# Patient Record
Sex: Male | Born: 1960 | Race: Black or African American | Hispanic: No | Marital: Married | Smoking: Current every day smoker
Health system: Southern US, Community
[De-identification: ages and names within clinical notes are randomized; demographics above are authoritative.]

## PROBLEM LIST (undated history)

## (undated) HISTORY — PX: SHOULDER SURGERY: SHX246

---

## 2010-07-08 ENCOUNTER — Emergency Department (HOSPITAL_COMMUNITY): Admission: EM | Admit: 2010-07-08 | Discharge: 2010-07-08 | Payer: Self-pay | Admitting: Emergency Medicine

## 2010-12-19 LAB — DIFFERENTIAL
Basophils Absolute: 0 10*3/uL (ref 0.0–0.1)
Eosinophils Relative: 1 % (ref 0–5)
Lymphocytes Relative: 29 % (ref 12–46)
Lymphs Abs: 2.1 10*3/uL (ref 0.7–4.0)
Monocytes Absolute: 0.5 10*3/uL (ref 0.1–1.0)
Neutro Abs: 4.7 10*3/uL (ref 1.7–7.7)

## 2010-12-19 LAB — BASIC METABOLIC PANEL
BUN: 19 mg/dL (ref 6–23)
Chloride: 108 mEq/L (ref 96–112)
GFR calc Af Amer: >60 mL/min (ref >60)
GFR calc non Af Amer: 53 mL/min — ABNORMAL LOW (ref >60)
Potassium: 4 mEq/L (ref 3.5–5.1)
Sodium: 140 mEq/L (ref 135–145)

## 2010-12-19 LAB — CBC
HCT: 43 % (ref 39.0–52.0)
Hemoglobin: 14.3 g/dL (ref 13.0–17.0)
MCV: 87.9 fL (ref 78.0–100.0)
RBC: 4.89 MIL/uL (ref 4.22–5.81)
RDW: 13.9 % (ref 11.5–15.5)
WBC: 7.3 10*3/uL (ref 4.0–10.5)

## 2010-12-19 LAB — RAPID URINE DRUG SCREEN, HOSP PERFORMED
Amphetamines: NOT DETECTED
Benzodiazepines: NOT DETECTED
Cocaine: POSITIVE — AB

## 2010-12-19 LAB — ETHANOL: Alcohol, Ethyl (B): <5 mg/dL (ref 0–10)

## 2012-08-24 ENCOUNTER — Emergency Department (HOSPITAL_COMMUNITY): Payer: Self-pay

## 2012-08-24 ENCOUNTER — Emergency Department (HOSPITAL_COMMUNITY)
Admission: EM | Admit: 2012-08-24 | Discharge: 2012-08-24 | Disposition: A | Discharge status: Home / Self Care | Payer: Self-pay | Attending: Emergency Medicine | Admitting: Emergency Medicine

## 2012-08-24 ENCOUNTER — Encounter (HOSPITAL_COMMUNITY): Payer: Self-pay | Admitting: *Deleted

## 2012-08-24 DIAGNOSIS — F17200 Nicotine dependence, unspecified, uncomplicated: Secondary | ICD-10-CM | POA: Insufficient documentation

## 2012-08-24 DIAGNOSIS — M25519 Pain in unspecified shoulder: Secondary | ICD-10-CM | POA: Insufficient documentation

## 2012-08-24 DIAGNOSIS — R9389 Abnormal findings on diagnostic imaging of other specified body structures: Secondary | ICD-10-CM | POA: Insufficient documentation

## 2012-08-24 DIAGNOSIS — M25512 Pain in left shoulder: Secondary | ICD-10-CM

## 2012-08-24 MED ORDER — CYCLOBENZAPRINE HCL 10 MG PO TABS: 10.0000 mg | ORAL_TABLET | Freq: Two times a day (BID) | ORAL | Status: DC | PRN

## 2012-08-24 MED ORDER — IBUPROFEN 600 MG PO TABS: 600.0000 mg | ORAL_TABLET | Freq: Four times a day (QID) | ORAL | Status: DC | PRN

## 2012-08-24 NOTE — ED Notes (Signed)
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 Pt states " I figured this was the job finally catching up to me or old age." "I'm just trying to be proactive, that's why I came to get checked out".

## 2012-08-24 NOTE — ED Notes (Signed)
Pt is here with left mid upper back pain that wraps around his left shoulder and has pain with movement.  No chest pain

## 2012-08-24 NOTE — ED Provider Notes (Signed)
History     CSN: [id number]  Arrival date & time 08/24/12  1024   First MD Initiated Contact with Patient 08/24/12 1111      Chief Complaint  Patient presents with  . Back Pain    (Consider location/radiation/quality/duration/timing/severity/associated sxs/prior treatment) HPI  51 year old male presents complaining of back pain.  Patient reports for the past 2 weeks he has had intermittent pain to his left shoulder most specifically to his shoulder blade. Onset was gradual, intermittent, mild in severity, Pain is a sharp sensation worsening with shoulder movement and improves with rest. Pain pain medication be felt to the left side of neck when he turned his neck to the right side.  He denies fever, chills, chest pain, shortness of breath, abdominal pain, dysuria, hematuria. He denies lightheadedness, or dizziness. No numbness or tingling sensation. He has not noticed any rash. No treatment tried. Patient is right-handed however he has a right shoulder surgery as a kid and has to use his left arm for any heavy movement. He did recall having to work the job several weeks ago when he has to do repetitive motion with his left arm. He has no significant vascular disease, and no history of kidney stone.    Pt is a smoker.  No family hx of cancer.  No fever, weight changes, nights sweats or myalgias.    History reviewed. No pertinent past medical history.  Past Surgical History  Procedure Date  . Shoulder surgery     right    No family history on file.  History  Substance Use Topics  . Smoking status: Current Every Day Smoker  . Smokeless tobacco: Not on file  . Alcohol Use: Yes     Comment: occ      Review of Systems  Constitutional: Negative for fever.  HENT: Negative for neck stiffness.   Respiratory: Negative for chest tightness and shortness of breath.   Cardiovascular: Negative for chest pain.  Musculoskeletal: Negative for back pain.  Skin: Negative for rash.    Neurological: Negative for numbness.    Allergies  Review of patient's allergies indicates no known allergies.  Home Medications  No current outpatient prescriptions on file.  BP 127/97  Pulse 87  Temp 97.8 F (36.6 C) (Oral)  Resp 18  SpO2 94%  Physical Exam  Nursing note and vitals reviewed. Constitutional: He is oriented to person, place, and time. He appears well-developed and well-nourished. No distress.  HENT:  Head: Atraumatic.  Eyes: Conjunctivae normal are normal.  Neck: Normal range of motion. Neck supple. No JVD present.  Cardiovascular: Normal rate and regular rhythm.   Pulmonary/Chest: Effort normal and breath sounds normal. No stridor. No respiratory distress. He exhibits no tenderness.  Abdominal: Soft. There is no tenderness.       No CVA tenderness  Musculoskeletal: He exhibits tenderness (mild tenderness to inferior aspect of L scapula on palpation.  No overlying skin changes.  L shoulder with FROM, radial pulses 2+, normal sensation to light touch through major nerve distribution.  ). He exhibits no edema.  Lymphadenopathy:    He has no cervical adenopathy.  Neurological: He is alert and oriented to person, place, and time.  Skin: Skin is warm. No rash noted.  Psychiatric: He has a normal mood and affect.    ED Course  Procedures (including critical care time)  Labs Reviewed - No data to display No results found.   No diagnosis found.   Date: 08/24/2012  Rate: 75  Rhythm: normal sinus rhythm  QRS Axis: normal  Intervals: normal  ST/T Wave abnormalities: nonspecific ST/T changes  Conduction Disutrbances:none  Narrative Interpretation: ST elevation suggestive of early repol  Old EKG Reviewed: none available  Results for orders placed during the hospital encounter of 07/08/10  ETHANOL      Component Value Range   Alcohol, Ethyl (B)    0 - 10 mg/dL   Value: <5            LOWEST DETECTABLE LIMIT FOR     SERUM ALCOHOL IS 5 mg/dL     FOR  MEDICAL PURPOSES ONLY  BASIC METABOLIC PANEL      Component Value Range   Sodium 140  135 - 145 mEq/L   Potassium 4.0  3.5 - 5.1 mEq/L   Chloride 108  96 - 112 mEq/L   CO2 25  19 - 32 mEq/L   Glucose, Bld 105 (*) 70 - 99 mg/dL   BUN 19  6 - 23 mg/dL   Creatinine, Ser 4.78  0.4 - 1.5 mg/dL   Calcium 9.3  8.4 - 29.5 mg/dL   GFR calc non Af Amer 53 (*) >60 mL/min   GFR calc Af Amer    >60 mL/min   Value: >60            The eGFR has been calculated     using the MDRD equation.     This calculation has not been     validated in all clinical     situations.     eGFR's persistently     <60 mL/min signify     possible Chronic Kidney Disease.  CBC      Component Value Range   WBC 7.3  4.0 - 10.5 K/uL   RBC 4.89  4.22 - 5.81 MIL/uL   Hemoglobin 14.3  13.0 - 17.0 g/dL   HCT 62.1  30.8 - 65.7 %   MCV 87.9  78.0 - 100.0 fL   MCH 29.3  26.0 - 34.0 pg   MCHC 33.3  30.0 - 36.0 g/dL   RDW 84.6  96.2 - 95.2 %   Platelets 304  150 - 400 K/uL  DIFFERENTIAL      Component Value Range   Neutrophils Relative 64  43 - 77 %   Neutro Abs 4.7  1.7 - 7.7 K/uL   Lymphocytes Relative 29  12 - 46 %   Lymphs Abs 2.1  0.7 - 4.0 K/uL   Monocytes Relative 6  3 - 12 %   Monocytes Absolute 0.5  0.1 - 1.0 K/uL   Eosinophils Relative 1  0 - 5 %   Eosinophils Absolute 0.1  0.0 - 0.7 K/uL   Basophils Relative 0  0 - 1 %   Basophils Absolute 0.0  0.0 - 0.1 K/uL  DRUG SCREEN PANEL, EMERGENCY      Component Value Range   Opiates NONE DETECTED  NONE DETECTED   Cocaine POSITIVE (*) NONE DETECTED   Benzodiazepines NONE DETECTED  NONE DETECTED   Amphetamines NONE DETECTED  NONE DETECTED   Tetrahydrocannabinol POSITIVE (*) NONE DETECTED   Barbiturates    NONE DETECTED   Value: NONE DETECTED            DRUG SCREEN FOR MEDICAL PURPOSES     ONLY.  IF CONFIRMATION IS NEEDED     FOR ANY PURPOSE, NOTIFY LAB     WITHIN 5 DAYS.  LOWEST DETECTABLE LIMITS     FOR URINE DRUG SCREEN     Drug Class        Cutoff (ng/mL)     Amphetamine      1000     Barbiturate      200     Benzodiazepine   200     Tricyclics       300     Opiates          300     Cocaine          300     THC              50   Dg Chest 2 View  08/24/2012  *RADIOLOGY REPORT*  Clinical Data: Left chest and shoulder pain.  Smoker.  CHEST - 2 VIEW  Comparison:  None.  Findings:  A ill-defined nodular opacity is seen in the left upper lung field on the frontal projection, which could represent a pulmonary nodule or focus of inflammatory or infectious infiltrate.  The lung fields otherwise clear.  No evidence of pleural effusion. Heart size is normal.  No hilar or mediastinal mass identified.  IMPRESSION: Subtle ill-defined asymmetric nodular opacity in the left upper lung field.  Neoplasm or infiltrate cannot be excluded.  Consider chest CT without contrast for further evaluation.   Original Report Authenticated By: Myles Rosenthal, M.D.    Ct Chest Wo Contrast  08/24/2012  *RADIOLOGY REPORT*  Clinical Data: Back pain.  Possible lung lesion.  CT CHEST WITHOUT CONTRAST  Technique:  Multidetector CT imaging of the chest was performed following the standard protocol without IV contrast.  Comparison: Chest x-ray 08/24/2012.  Findings:  Mediastinum: Heart size is normal. There is no significant pericardial fluid, thickening or pericardial calcification.  There do appear to be some small extensions of fluid from the superior pericardial recess posterior to the ascending thoracic aorta (a benign variant). No pathologically enlarged mediastinal or hilar lymph nodes. Please note that accurate exclusion of hilar adenopathy is limited on noncontrast CT scans.  Esophagus is unremarkable in appearance.  There is atherosclerosis of the thoracic aorta, the great vessels of the mediastinum and the coronary arteries, including calcified atherosclerotic plaque in the left anterior descending coronary arteries.  Lungs/Pleura: 3 mm subpleural nodule in the  anterior aspect of the right lower lobe (image 40 of series 3).  4 mm subpleural nodule in the apex of the right upper lobe (image 13 of series 3).  4 mm subpleural nodule in the periphery of the left lower lobe (image 40 of series 3).  2 mm subpleural nodule in the periphery of the lingula (image 27 of series 3).  No other larger more suspicious appearing pulmonary nodules or masses are otherwise identified. Specifically, in the left upper lobe there is no suspicious abnormality to account for the perceived finding on the recent chest radiograph.  Mild diffuse bronchial wall thickening with mild paraseptal emphysema.  Upper Abdomen: Unremarkable.  Musculoskeletal: Orthopedic fixation in the coracoid process of the right scapula. There are no aggressive appearing lytic or blastic lesions noted in the visualized portions of the skeleton.  IMPRESSION: 1.  No suspicious appearing pulmonary nodules or masses are identified. 2.  However, there are multiple nonspecific pulmonary nodules scattered throughout the periphery of the lungs bilaterally ranging in size from 2-4 mm.  These are favored to represent subpleural lymph nodes, however, given the smoking related changes in the lungs, attention on a 1-year chest  CT in August 25, 2013 is recommended to ensure their stability. This recommendation follows the consensus statement: Guidelines for Management of Small Pulmonary Nodules Detected on CT Scans:  A Statement from the Fleischner Society as published in Radiology 2005; 237:395-400.  3. Atherosclerosis, including left anterior descending coronary artery disease. Please note that although the presence of coronary artery calcium documents the presence of coronary artery disease, the severity of this disease and any potential stenosis cannot be assessed on this non-gated CT examination.  Assessment for potential risk factor modification, dietary therapy or pharmacologic therapy may be warranted, if clinically indicated.  4.  Additional incidental findings, as above.   Original Report Authenticated By: Trudie Reed, M.D.     1. Left shoulder pain  MDM  L shoulder/L scapular pain x 2 weeks, likely muscle strain.  Doubt fx or dislocation considering pt has normal ROM.  Doubt radicular pain, vascular etiology, or referred abd pain as pt has low risk and story does not fit.  Pt has not tried any treatment and he has minimal pain.  He sts he thinks it's likely muscle strain but just want to check to make sure it's nothing else significant.  I agree with him.    Recommend RICE treatment, ibuprofen and muscle relaxant given.  Strict return precaution discussed.  Ortho referral given.    12:39 PM Care discussed with my attending.  1:43 PM CXR shows a subtle ill-defined asymmetric nodular opacity in the left upper lung field with recommend chest CT w/out cm for further evaluation.  CT ordered.  Pt agrees with plan.    4:01 PM Chest CT shows no suspicious appearing pulmonary nodules or masses.  There are several nonspecific pulmonary nodules which may represent subpleural lymph nodes.  I discussed the result with patient and recommend repeat CT scan in 1 year as recommended.  I discussed smoking cessation.  Will also recommend f/u with PCP for further care.    Will treat pain for muscle strain with NSAIDs and muscle relaxant.  Pt voice understanding and agrees with plan.  Discussed with my attending.    BP 127/97  Pulse 87  Temp 97.8 F (36.6 C) (Oral)  Resp 18  SpO2 94%  I have reviewed nursing notes and vital signs. I personally reviewed the imaging tests through PACS system  I reviewed available ER/hospitalization records thought the EMR     Fayrene Helper, [place] 08/24/12 1603

## 2012-08-24 NOTE — ED Notes (Signed)
Pt given gown to change into at this time and informed where the bathroom was as he requested it.

## 2012-08-24 NOTE — ED Provider Notes (Signed)
Medical screening examination/treatment/procedure(s) were performed by non-physician practitioner and as supervising physician I was immediately available for consultation/collaboration.   Lyanne Co, MD 08/24/12 [phone]

## 2012-08-24 NOTE — ED Notes (Signed)
Patient transported to X-ray 

## 2013-08-17 ENCOUNTER — Emergency Department (HOSPITAL_COMMUNITY): Payer: Self-pay

## 2013-08-17 ENCOUNTER — Emergency Department (HOSPITAL_COMMUNITY)
Admission: EM | Admit: 2013-08-17 | Discharge: 2013-08-17 | Disposition: A | Discharge status: Home / Self Care | Payer: Self-pay | Attending: Emergency Medicine | Admitting: Emergency Medicine

## 2013-08-17 ENCOUNTER — Encounter (HOSPITAL_COMMUNITY): Payer: Self-pay | Admitting: Emergency Medicine

## 2013-08-17 DIAGNOSIS — F17200 Nicotine dependence, unspecified, uncomplicated: Secondary | ICD-10-CM | POA: Insufficient documentation

## 2013-08-17 DIAGNOSIS — J209 Acute bronchitis, unspecified: Secondary | ICD-10-CM | POA: Insufficient documentation

## 2013-08-17 DIAGNOSIS — J208 Acute bronchitis due to other specified organisms: Secondary | ICD-10-CM

## 2013-08-17 DIAGNOSIS — R05 Cough: Secondary | ICD-10-CM

## 2013-08-17 DIAGNOSIS — R6889 Other general symptoms and signs: Secondary | ICD-10-CM | POA: Insufficient documentation

## 2013-08-17 MED ORDER — HYDROCODONE-HOMATROPINE 5-1.5 MG/5ML PO SYRP: 5.0000 mL | ORAL_SOLUTION | Freq: Four times a day (QID) | ORAL | Status: DC | PRN

## 2013-08-17 NOTE — ED Notes (Signed)
The pt has had a cough for 3-4 weeks and he cannot get rid of it.  He is a smoker.  Sometimes productvie

## 2013-08-17 NOTE — ED Provider Notes (Signed)
Medical screening examination/treatment/procedure(s) were performed by non-physician practitioner and as supervising physician I was immediately available for consultation/collaboration.  EKG Interpretation   None         Junius Argyle, MD 08/17/13 2014

## 2013-08-17 NOTE — ED Notes (Signed)
Pt. Stated, I've had a cough for 3 weeks.  i have some congestion, Denies any pain

## 2013-08-17 NOTE — ED Provider Notes (Signed)
CSN: [id number]     Arrival date & time 08/17/13  1523 History  This chart was scribed for non-physician practitioner Dierdre Forth, PA-C working with Junius Argyle, MD by Valera Castle, ED scribe. This patient was seen in room TR09C/TR09C and the patient's care was started at 6:10 PM.   Chief Complaint  Patient presents with  . Cough   The history is provided by the patient and medical records. No language interpreter was used.   HPI Comments: Randy Alexander is a 52 y.o. male who presents to the Emergency Department complaining of gradual, moderate, intermittent, cough, productive of sputum, onset 3 weeks ago. He reports associated congestion, and cold symptoms initially, but reports after 10 days the other symptoms have subsided. The cough has been the only persisting symptom.  He reports some tingling and scratchiness in his throat, but is unsure about drainage. He reports that deep breathing sometimes exacerbates the cough, but denies the cough keeping him awake at night. He reports having tried "everything" to relieve the cough, with no relief. He denies h/o lung problems. He denies fever, chills, nausea, emesis, and any other associated symptoms. He reports smoking about 5 cigarettes a day for the last 30 days. He denies any other medical history. He denies having insurance.   PCP-No primary provider on file.  History reviewed. No pertinent past medical history. Past Surgical History  Procedure Laterality Date  . Shoulder surgery      right   No family history on file. History  Substance Use Topics  . Smoking status: Current Every Day Smoker  . Smokeless tobacco: Not on file  . Alcohol Use: Yes     Comment: occ    Review of Systems  Constitutional: Negative for fever and chills.  HENT: Negative for congestion, ear pain, rhinorrhea, sinus pressure, sore throat and trouble swallowing.        Positive for scratchy, tingling throat.  Respiratory: Positive for cough.    Gastrointestinal: Negative for nausea and vomiting.  Neurological: Negative for headaches.  All other systems reviewed and are negative.   Allergies  Review of patient's allergies indicates no known allergies.  Home Medications   Current Outpatient Rx  Name  Route  Sig  Dispense  Refill  . Phenylephrine-Pheniramine-DM (THERAFLU COLD & COUGH PO)   Oral   Take 1 packet by mouth every 6 (six) hours as needed (cold symptoms).         . Pseudoephedrine-DM-GG (ROBITUSSIN COLD & COUGH PO)   Oral   Take 30 mLs by mouth every 6 (six) hours as needed (cough).         . Pseudoephedrine-Guaifenesin (MUCINEX D PO)   Oral   Take 2 tablets by mouth daily as needed (cold symptoms).         Marland Kitchen HYDROcodone-homatropine (HYCODAN) 5-1.5 MG/5ML syrup   Oral   Take 5 mLs by mouth every 6 (six) hours as needed for cough.   120 mL   0    Triage Vitals: BP 120/80  Pulse 92  Temp(Src) 98 F (36.7 C) (Oral)  Resp 18  Wt 234 lb (106.142 kg)  SpO2 98%  Physical Exam  Nursing note and vitals reviewed. Constitutional: He is oriented to person, place, and time. He appears well-developed and well-nourished. No distress.  Awake, alert, nontoxic appearance  HENT:  Head: Normocephalic and atraumatic.  Right Ear: Tympanic membrane, external ear and ear canal normal.  Left Ear: Tympanic membrane, external ear and ear canal  normal.  Nose: Mucosal edema and rhinorrhea present. No epistaxis. Right sinus exhibits no maxillary sinus tenderness and no frontal sinus tenderness. Left sinus exhibits no maxillary sinus tenderness and no frontal sinus tenderness.  Mouth/Throat: Uvula is midline and mucous membranes are normal. Mucous membranes are not pale and not cyanotic. No oropharyngeal exudate, posterior oropharyngeal edema, posterior oropharyngeal erythema or tonsillar abscesses.  Eyes: Conjunctivae are normal. Pupils are equal, round, and reactive to light. No scleral icterus.  Neck: Normal range of  motion and full passive range of motion without pain. Neck supple.  Cardiovascular: Normal rate, regular rhythm, normal heart sounds and intact distal pulses.  Exam reveals no gallop and no friction rub.   No murmur heard. Regular rate and rhythm  Pulmonary/Chest: Effort normal and breath sounds normal. No stridor. No respiratory distress. He has no wheezes. He has no rales.  Clear and equal breath sounds  Abdominal: Soft. Bowel sounds are normal. He exhibits no mass. There is no tenderness. There is no rebound and no guarding.  Musculoskeletal: Normal range of motion. He exhibits no edema.  Lymphadenopathy:    He has no cervical adenopathy.  Neurological: He is alert and oriented to person, place, and time. He exhibits normal muscle tone. Coordination normal.  Speech is clear and goal oriented Moves extremities without ataxia  Skin: Skin is warm and dry. No rash noted. He is not diaphoretic.  Psychiatric: He has a normal mood and affect.    ED Course  Procedures (including critical care time)  DIAGNOSTIC STUDIES: Oxygen Saturation is 98% on room air, normal by my interpretation.    COORDINATION OF CARE: 6:14 PM-Discussed imagining results and clinical suspicion of Bronchitis, as well as treatment plan with pt at bedside and pt agreed to plan. Discussed clinical doubt of bacterial infection with pt. Advised cough syrup at night and Mucinex during the day, as well as humidifier. Advised pt to f/u if he has fever and worsened SOB.   Labs Review Labs Reviewed - No data to display Imaging Review Dg Chest 2 View  08/17/2013   CLINICAL DATA:  Smoker with a cough.  EXAM: CHEST  2 VIEW  COMPARISON:  08/24/2012.  FINDINGS: Normal sized heart. Clear lungs. Stable diffuse peribronchial thickening and right scapular fixation screw. Right shoulder degenerative changes.  IMPRESSION: No acute abnormality.  Stable mild chronic bronchitic changes.   Electronically Signed   By: Gordan Payment M.D.   On:  08/17/2013 18:02    EKG Interpretation   None       MDM   1. Viral bronchitis   2. Cough     Rafferty L Geary presents with persistent cough 3 weeks after the onset of viral URI.  Pt is a smoker and continues to smoke. Pt cough likely persistent from bronchitis.  Will obtain CXR.  Patient afebrile, non-tachycardic; doubt pneumonia.  .Pt CXR negative for acute infiltrate. I personally reviewed the imaging tests through PACS system.  I reviewed available ER/hospitalization records through the EMR.  Patients symptoms are consistent with bronchitis, likely viral etiology. Discussed that antibiotics are not indicated for viral infections. Pt will be discharged with symptomatic treatment.  Verbalizes understanding and is agreeable with plan. Pt is hemodynamically stable & in NAD prior to dc.  It has been determined that no acute conditions requiring further emergency intervention are present at this time. The patient/guardian have been advised of the diagnosis and plan. We have discussed signs and symptoms that warrant return to the ED,  such as changes or worsening in symptoms.   Vital signs are stable at discharge.   BP 125/87  Pulse 79  Temp(Src) 98 F (36.7 C) (Oral)  Resp 16  Wt 234 lb (106.142 kg)  SpO2 97%  Patient/guardian has voiced understanding and agreed to follow-up with the PCP or specialist.    I personally performed the services described in this documentation, which was scribed in my presence. The recorded information has been reviewed and is accurate.    Dierdre Forth, PA-C 08/17/13 1949

## 2016-12-05 ENCOUNTER — Ambulatory Visit (INDEPENDENT_AMBULATORY_CARE_PROVIDER_SITE_OTHER): Payer: BLUE CROSS/BLUE SHIELD | Admitting: Podiatry

## 2016-12-05 ENCOUNTER — Encounter: Payer: Self-pay | Admitting: Podiatry

## 2016-12-05 ENCOUNTER — Ambulatory Visit (INDEPENDENT_AMBULATORY_CARE_PROVIDER_SITE_OTHER): Payer: Self-pay

## 2016-12-05 VITALS — BP 133/85 | HR 70 | Resp 16 | Ht 71.0 in | Wt 230.0 lb

## 2016-12-05 DIAGNOSIS — M79672 Pain in left foot: Secondary | ICD-10-CM

## 2016-12-05 DIAGNOSIS — M79671 Pain in right foot: Secondary | ICD-10-CM

## 2016-12-05 DIAGNOSIS — M722 Plantar fascial fibromatosis: Secondary | ICD-10-CM

## 2016-12-05 MED ORDER — PREDNISONE 10 MG PO TABS: ORAL_TABLET | ORAL | 0 refills | Status: DC

## 2016-12-05 MED ORDER — TRIAMCINOLONE ACETONIDE 10 MG/ML IJ SUSP
10.0000 mg | Freq: Once | INTRAMUSCULAR | Status: AC
  Administered 2016-12-05: 10 mg

## 2016-12-05 NOTE — Patient Instructions (Signed)

## 2016-12-05 NOTE — Progress Notes (Signed)
   Subjective:    Patient ID: Randy Alexander, male    DOB: 11/02/1960, 56 y.o.   MRN: [id number]  HPI Chief Complaint  Patient presents with  . Foot Pain    Right foot; bottom of heel; pt stated, "foot is very painful"; x2-3 weeks      Review of Systems  All other systems reviewed and are negative.      Objective:   Physical Exam        Assessment & Plan:

## 2016-12-07 NOTE — Progress Notes (Signed)
Subjective:     Patient ID: Randy ShoneRicky L Bendall, male   DOB: December 23, 1960, 56 y.o.   MRN: [id number]  HPI patient presents stating he is developed severe discomfort in the plantar aspect of the right heel recently with worsening of symptoms over the last few weeks. Patient states she's tried shoe gear modifications ice without relief   Review of Systems  All other systems reviewed and are negative.      Objective:   Physical Exam  Constitutional: He is oriented to person, place, and time.  Cardiovascular: Intact distal pulses.   Musculoskeletal: Normal range of motion.  Neurological: He is oriented to person, place, and time.  Skin: Skin is warm.  Nursing note and vitals reviewed.  neurovascular status intact muscle strength adequate range of motion within normal limits with patient found to have exquisite discomfort plantar aspect right heel at the insertional point of the tendon into the calcaneus with inflammation and fluid noted upon palpation. Patient has moderate depression of the arch noted     Assessment:     Inflammatory fasciitis of the heel right secondary to mechanical dysfunction and probable trauma    Plan:     H&P conditions reviewed and at this time I injected the plantar fascial right 3 mg Kenalog 5 mg Xylocaine and applied fascial brace and placed on Sterapred 12 day Dosepak. Gave instructions on reduced activity and reappoint in 2 weeks  X-ray report indicates that there is depression of the arch with spur formation with no signs stress fracture arthritis

## 2016-12-19 ENCOUNTER — Encounter: Payer: Self-pay | Admitting: Podiatry

## 2016-12-19 ENCOUNTER — Ambulatory Visit (INDEPENDENT_AMBULATORY_CARE_PROVIDER_SITE_OTHER): Payer: BLUE CROSS/BLUE SHIELD | Admitting: Podiatry

## 2016-12-19 DIAGNOSIS — M722 Plantar fascial fibromatosis: Secondary | ICD-10-CM

## 2016-12-19 MED ORDER — DICLOFENAC SODIUM 75 MG PO TBEC: 75.0000 mg | DELAYED_RELEASE_TABLET | Freq: Two times a day (BID) | ORAL | 2 refills | Status: AC

## 2016-12-19 MED ORDER — TRIAMCINOLONE ACETONIDE 10 MG/ML IJ SUSP
10.0000 mg | Freq: Once | INTRAMUSCULAR | Status: AC
  Administered 2016-12-19: 10 mg

## 2016-12-21 NOTE — Progress Notes (Signed)
Subjective:     Patient ID: Randy Alexander, male   DOB: 04-02-1961, 56 y.o.   MRN: [id number]  HPI patient continues to exhibit quite a bit of discomfort in the right heel with improvement from previous visit but pain still noted   Review of Systems     Objective:   Physical Exam Neurovascular status intact with discomfort in the right plantar fascia at the insertional point of the tendon into the calcaneus with inflammation and fluid buildup noted with depression of the arch also noted    Assessment:     Acute plantar fasciitis of the right heel at the insertional point tendon into the calcaneus    Plan:     H&P condition reviewed and reinjected the plantar fascia right 3 mg Kenalog 5 mg Xylocaine and scanned for custom orthotics to lift the arch with continuation of fascial brace. Reappoint when they are ready and I will also look at Randy Alexander and the same time to judge response

## 2016-12-26 DIAGNOSIS — R52 Pain, unspecified: Secondary | ICD-10-CM

## 2017-01-15 ENCOUNTER — Encounter: Payer: Self-pay | Admitting: Podiatry

## 2017-01-15 ENCOUNTER — Ambulatory Visit (INDEPENDENT_AMBULATORY_CARE_PROVIDER_SITE_OTHER): Payer: BLUE CROSS/BLUE SHIELD | Admitting: Podiatry

## 2017-01-15 DIAGNOSIS — M722 Plantar fascial fibromatosis: Secondary | ICD-10-CM

## 2017-01-15 MED ORDER — TRIAMCINOLONE ACETONIDE 10 MG/ML IJ SUSP
10.0000 mg | Freq: Once | INTRAMUSCULAR | Status: AC
  Administered 2017-01-15: 10 mg

## 2017-01-15 NOTE — Patient Instructions (Signed)

## 2017-01-18 NOTE — Progress Notes (Signed)
Subjective:     Patient ID: Randy Alexander, male   DOB: March 31, 1961, 56 y.o.   MRN: [id number]  HPI patient states she's doing pretty good but has one spot on the right heel that remains tender when he is ambulating   Review of Systems     Objective:   Physical Exam Neurovascular status intact with deformity and inflammation of the plantar fascia right at the insertional point of the tendon into the calcaneus with fluid buildup    Assessment:     Lanter fasciitis right with inflammation at the insertion    Plan:     H&P done injected the fascia 3 mg Kenalog 5 mg Xylocaine at the insertion and dispensed orthotics with all instructions on usage

## 2017-02-12 ENCOUNTER — Ambulatory Visit: Payer: BLUE CROSS/BLUE SHIELD | Admitting: Podiatry

## 2017-02-26 ENCOUNTER — Ambulatory Visit: Payer: BLUE CROSS/BLUE SHIELD | Admitting: Podiatry

## 2017-03-10 ENCOUNTER — Encounter (HOSPITAL_COMMUNITY): Payer: Self-pay

## 2017-03-10 DIAGNOSIS — Z79899 Other long term (current) drug therapy: Secondary | ICD-10-CM | POA: Diagnosis not present

## 2017-03-10 DIAGNOSIS — R1032 Left lower quadrant pain: Secondary | ICD-10-CM | POA: Diagnosis present

## 2017-03-10 DIAGNOSIS — F17200 Nicotine dependence, unspecified, uncomplicated: Secondary | ICD-10-CM | POA: Diagnosis not present

## 2017-03-10 LAB — URINALYSIS, ROUTINE W REFLEX MICROSCOPIC
BILIRUBIN URINE: NEGATIVE
GLUCOSE, UA: NEGATIVE mg/dL
Hgb urine dipstick: NEGATIVE
KETONES UR: NEGATIVE mg/dL
LEUKOCYTES UA: NEGATIVE
Nitrite: NEGATIVE
PH: 7 (ref 5.0–8.0)
PROTEIN: NEGATIVE mg/dL
Specific Gravity, Urine: 1.013 (ref 1.005–1.030)

## 2017-03-10 LAB — CBC
HEMATOCRIT: 40.7 % (ref 39.0–52.0)
Hemoglobin: 13.4 g/dL (ref 13.0–17.0)
MCH: 28.6 pg (ref 26.0–34.0)
MCHC: 32.9 g/dL (ref 30.0–36.0)
MCV: 87 fL (ref 78.0–100.0)
PLATELETS: 305 10*3/uL (ref 150–400)
RBC: 4.68 MIL/uL (ref 4.22–5.81)
RDW: 13.3 % (ref 11.5–15.5)
WBC: 8.1 10*3/uL (ref 4.0–10.5)

## 2017-03-10 NOTE — ED Triage Notes (Signed)
Pt reports LLQ abdominal pain that started last night. He denies n/v/d. He states he spit up blood today around one hour PTA. Denies cough, denies vomiting.

## 2017-03-11 ENCOUNTER — Emergency Department (HOSPITAL_COMMUNITY)
Admission: EM | Admit: 2017-03-11 | Discharge: 2017-03-11 | Disposition: A | Discharge status: Home / Self Care | Payer: BLUE CROSS/BLUE SHIELD | Attending: Emergency Medicine | Admitting: Emergency Medicine

## 2017-03-11 ENCOUNTER — Emergency Department (HOSPITAL_COMMUNITY): Payer: BLUE CROSS/BLUE SHIELD

## 2017-03-11 DIAGNOSIS — R1032 Left lower quadrant pain: Secondary | ICD-10-CM

## 2017-03-11 LAB — LIPASE, BLOOD: Lipase: 25 U/L (ref 11–51)

## 2017-03-11 LAB — COMPREHENSIVE METABOLIC PANEL
ALK PHOS: 137 U/L — AB (ref 38–126)
ALT: 12 U/L — AB (ref 17–63)
AST: 15 U/L (ref 15–41)
Albumin: 3.7 g/dL (ref 3.5–5.0)
Anion gap: 9 (ref 5–15)
BUN: 19 mg/dL (ref 6–20)
CALCIUM: 9.1 mg/dL (ref 8.9–10.3)
CO2: 26 mmol/L (ref 22–32)
CREATININE: 1.25 mg/dL — AB (ref 0.61–1.24)
Chloride: 103 mmol/L (ref 101–111)
Glucose, Bld: 96 mg/dL (ref 65–99)
Potassium: 3.9 mmol/L (ref 3.5–5.1)
SODIUM: 138 mmol/L (ref 135–145)
Total Bilirubin: 0.7 mg/dL (ref 0.3–1.2)
Total Protein: 7.1 g/dL (ref 6.5–8.1)

## 2017-03-11 MED ORDER — HYDROCODONE-ACETAMINOPHEN 5-325 MG PO TABS: 1.0000 | ORAL_TABLET | Freq: Four times a day (QID) | ORAL | 0 refills | Status: AC | PRN

## 2017-03-11 MED ORDER — METRONIDAZOLE 500 MG PO TABS
500.0000 mg | ORAL_TABLET | Freq: Once | ORAL | Status: AC
  Administered 2017-03-11: 500 mg via ORAL
  Filled 2017-03-11: qty 1

## 2017-03-11 MED ORDER — CIPROFLOXACIN HCL 500 MG PO TABS
500.0000 mg | ORAL_TABLET | Freq: Once | ORAL | Status: AC
  Administered 2017-03-11: 500 mg via ORAL
  Filled 2017-03-11: qty 1

## 2017-03-11 MED ORDER — CIPROFLOXACIN HCL 500 MG PO TABS: 500.0000 mg | ORAL_TABLET | Freq: Two times a day (BID) | ORAL | 0 refills | Status: AC

## 2017-03-11 MED ORDER — METRONIDAZOLE 500 MG PO TABS: 500.0000 mg | ORAL_TABLET | Freq: Two times a day (BID) | ORAL | 0 refills | Status: AC

## 2017-03-11 NOTE — ED Provider Notes (Signed)
[facility]-EMERGENCY DEPT Provider Note   CSN: [id number] Arrival date & time: 03/10/17  2230  By signing my name below, I, Bing NeighborsMaurice Deon Copeland Jr., attest that this documentation has been prepared under the direction and in the presence of Aaron Bostwick, Mayer Maskerourtney F, MD. Electronically signed: Bing NeighborsMaurice Deon Copeland Jr., ED Scribe. 03/11/17. 2:41 AM.   History   Chief Complaint Chief Complaint  Patient presents with  . Abdominal Pain    HPI  Randy Alexander is a 56 y.o. male with no significant tmedical hx who presents to the Emergency Department complaining of worsening LLQ abdominal pain with onset x2 days. Pt states that for the past x2 days he has had worsening abdominal pain that he describes as painful, sharp and nonradiating. Pt rates the pain 5-6/10.  Patient states that he coughed once a day noted streaking blood in the sputum. No other cough or fevers.. He denies any modifying factors but states that the area is tender to palpation. Pt denies nausea, vomiting, diarrhea, cough, hematemesis, dysuria, mass in groin, hernia. He denies smoking, alcohol abuse. Of note, pt had colonoscopy x2 months ago which was normal.    The history is provided by the patient. No language interpreter was used.    History reviewed. No pertinent past medical history.  There are no active problems to display for this patient.   Past Surgical History:  Procedure Laterality Date  . SHOULDER SURGERY     right       Home Medications    Prior to Admission medications   Medication Sig Start Date End Date Taking? Authorizing Provider  ciprofloxacin (CIPRO) 500 MG tablet Take 1 tablet (500 mg total) by mouth 2 (two) times daily. 03/11/17   Cambrey Lupi, Mayer Maskerourtney F, MD  diclofenac (VOLTAREN) 75 MG EC tablet Take 1 tablet (75 mg total) by mouth 2 (two) times daily. Patient not taking: Reported on 03/11/2017 12/19/16   Lenn Sinkegal, Norman S, DPM  HYDROcodone-acetaminophen (NORCO/VICODIN) 5-325 MG tablet Take 1-2 tablets by  mouth every 6 (six) hours as needed. 03/11/17   Destanie Tibbetts, Mayer Maskerourtney F, MD  metroNIDAZOLE (FLAGYL) 500 MG tablet Take 1 tablet (500 mg total) by mouth 2 (two) times daily. 03/11/17   Aune Adami, Mayer Maskerourtney F, MD    Family History No family history on file.  Social History Social History  Substance Use Topics  . Smoking status: Current Every Day Smoker  . Smokeless tobacco: Never Used  . Alcohol use Yes     Comment: occ     Allergies   Patient has no known allergies.   Review of Systems Review of Systems  Constitutional: Negative for fever.  Respiratory: Negative for cough.   Gastrointestinal: Positive for abdominal pain. Negative for blood in stool, diarrhea, nausea and vomiting.  Genitourinary: Negative for dysuria.  Musculoskeletal: Negative for back pain.  All other systems reviewed and are negative.    Physical Exam Updated Vital Signs BP 123/90   Pulse 64   Temp 97.8 F (36.6 C) (Oral)   Resp 16   SpO2 97%   Physical Exam  Constitutional: He is oriented to person, place, and time. He appears well-developed and well-nourished. No distress.  HENT:  Head: Normocephalic and atraumatic.  Cardiovascular: Normal rate, regular rhythm and normal heart sounds.   No murmur heard. Pulmonary/Chest: Effort normal and breath sounds normal. No respiratory distress. He has no wheezes.  Abdominal: Soft. Bowel sounds are normal. He exhibits no mass. There is tenderness. There is no rebound and no guarding.  No hernia.  Left lower quadrant tenderness to palpation without rebound or guarding  Neurological: He is alert and oriented to person, place, and time.  Skin: Skin is warm and dry.  Psychiatric: He has a normal mood and affect.  Nursing note and vitals reviewed.    ED Treatments / Results   DIAGNOSTIC STUDIES: Oxygen Saturation is 97% on RA, adequate by my interpretation.   COORDINATION OF CARE: 2:41 AM-Discussed next steps with pt. Pt verbalized understanding and is agreeable  with the plan.    Labs (all labs ordered are listed, but only abnormal results are displayed) Labs Reviewed  COMPREHENSIVE METABOLIC PANEL - Abnormal; Notable for the following:       Result Value   Creatinine, Ser 1.25 (*)    ALT 12 (*)    Alkaline Phosphatase 137 (*)    All other components within normal limits  URINALYSIS, ROUTINE W REFLEX MICROSCOPIC - Abnormal; Notable for the following:    Color, Urine STRAW (*)    All other components within normal limits  LIPASE, BLOOD  CBC    EKG  EKG Interpretation None       Radiology Dg Abdomen Acute W/chest  Result Date: 03/11/2017 CLINICAL DATA:  Lower left abdominal pain EXAM: DG ABDOMEN ACUTE W/ 1V CHEST COMPARISON:  CXR 08/17/2013 FINDINGS: There is no evidence of dilated bowel loops or free intraperitoneal air. No radiopaque calculi or other significant radiographic abnormality is seen. The cardiac silhouette is borderline enlarged. There is slight uncoiling of the thoracic aorta without aneurysm. Single screw fixation about the right glenoid. IMPRESSION: Negative abdominal radiographs.  No acute cardiopulmonary disease. Electronically Signed   By: Tollie Eth M.D.   On: 03/11/2017 02:25    Procedures Procedures (including critical care time)  Medications Ordered in ED Medications  ciprofloxacin (CIPRO) tablet 500 mg (500 mg Oral Given 03/11/17 0157)  metroNIDAZOLE (FLAGYL) tablet 500 mg (500 mg Oral Given 03/11/17 0157)     Initial Impression / Assessment and Plan / ED Course  I have reviewed the triage vital signs and the nursing notes.  Pertinent labs & imaging results that were available during my care of the patient were reviewed by me and considered in my medical decision making (see chart for details).     Patient presents with 2 days of left lower quadrant pain. Nontoxic on exam. Afebrile. Tender without signs of peritonitis. Considerations include diverticulitis, kidney stones, epiploic appendicitis. Given  reproducible nature and no urinary symptoms, doubt kidney stones.  Lab work is all largely reassuring. He has a mild elevation in his alkaline phosphatase. No leukocytosis. No evidence of urinary tract infection. No hematuria. Discussed with patient options including empiric treatment with antibiotics for diverticulitis versus CT scan for further evaluation. Patient declines all pain medication at this time. He is tolerating fluids. Acute abdominal series including chest is reassuring. Patient would like to forego CT scan at this time. Will discharge with Cipro and Flagyl. I have advised the patient that if he is not feeling better in 24-48 hrs. or has new or worsening symptoms he needs to be reevaluated immediately.  After history, exam, and medical workup I feel the patient has been appropriately medically screened and is safe for discharge home. Pertinent diagnoses were discussed with the patient. Patient was given return precautions.   Final Clinical Impressions(s) / ED Diagnoses   Final diagnoses:  Left lower quadrant pain    New Prescriptions New Prescriptions   CIPROFLOXACIN (CIPRO) 500 MG TABLET  Take 1 tablet (500 mg total) by mouth 2 (two) times daily.   HYDROCODONE-ACETAMINOPHEN (NORCO/VICODIN) 5-325 MG TABLET    Take 1-2 tablets by mouth every 6 (six) hours as needed.   METRONIDAZOLE (FLAGYL) 500 MG TABLET    Take 1 tablet (500 mg total) by mouth 2 (two) times daily.   I personally performed the services described in this documentation, which was scribed in my presence. The recorded information has been reviewed and is accurate.      Shon Baton, MD 03/11/17 [phone]

## 2017-03-11 NOTE — ED Notes (Signed)
Patient transported to X-ray 

## 2017-03-11 NOTE — Discharge Instructions (Signed)
You were seen today for left lower quadrant pain. Your symptoms are most consistent with diverticulitis. Your well-appearing without fever. Your lab work and x-rays are reassuring. You will be presumptively treated for diverticulitis. If you develop worsening symptoms, fever or any new or worsening symptoms she needs to be reevaluated immediately and may need a CT scan.

## 2017-04-02 ENCOUNTER — Other Ambulatory Visit: Payer: Self-pay | Admitting: Family Medicine

## 2017-04-02 DIAGNOSIS — R748 Abnormal levels of other serum enzymes: Secondary | ICD-10-CM

## 2018-11-08 ENCOUNTER — Encounter (HOSPITAL_COMMUNITY): Payer: Self-pay | Admitting: *Deleted

## 2018-11-08 ENCOUNTER — Other Ambulatory Visit: Payer: Self-pay

## 2018-11-08 ENCOUNTER — Emergency Department (HOSPITAL_COMMUNITY)
Admission: EM | Admit: 2018-11-08 | Discharge: 2018-11-08 | Disposition: A | Discharge status: Home / Self Care | Payer: BLUE CROSS/BLUE SHIELD | Attending: Emergency Medicine | Admitting: Emergency Medicine

## 2018-11-08 DIAGNOSIS — R51 Headache: Secondary | ICD-10-CM | POA: Insufficient documentation

## 2018-11-08 DIAGNOSIS — I10 Essential (primary) hypertension: Secondary | ICD-10-CM | POA: Insufficient documentation

## 2018-11-08 DIAGNOSIS — F17210 Nicotine dependence, cigarettes, uncomplicated: Secondary | ICD-10-CM | POA: Insufficient documentation

## 2018-11-08 NOTE — Discharge Instructions (Addendum)
Please contact any of the primary care doctor offices mentioned above. Take the blood pressure as requested. If your blood pressure stays over 140/90 consistently, then your primary care doctor will probably start you on a low-dose blood pressure medication.

## 2018-11-08 NOTE — ED Provider Notes (Signed)
[facility] Provider Note   CSN: [id number] Arrival date & time: 11/08/18  0119     History   Chief Complaint Chief Complaint  Patient presents with  . Hypertension    HPI Randy Alexander is a 58 y.o. male.  HPI  Patient comes in with chief complaint of elevated blood pressure and mild headaches.  Patient states that he was laying in the couch, got up and started feeling dizzy.  He felt like he was going to pass out.  He also had mild headache at this time.  He has been noticing that he has had elevated blood pressure over the past few days as well he decided to come to the ER for further evaluation.  At the moment of my evaluation patient does not have any headache nor does he feel dizzy.  Patient does not have any medical history.  He denies any substance abuse, heavy drinking or smoking.  He does have family history of hypertension.  Patient also denies any associated chest pain, shortness of breath.  History reviewed. No pertinent past medical history.  There are no active problems to display for this patient.   Past Surgical History:  Procedure Laterality Date  . SHOULDER SURGERY     right        Home Medications    Prior to Admission medications   Medication Sig Start Date End Date Taking? Authorizing Provider  ciprofloxacin (CIPRO) 500 MG tablet Take 1 tablet (500 mg total) by mouth 2 (two) times daily. 03/11/17   Horton, Mayer Maskerourtney F, MD  diclofenac (VOLTAREN) 75 MG EC tablet Take 1 tablet (75 mg total) by mouth 2 (two) times daily. Patient not taking: Reported on 03/11/2017 12/19/16   Lenn Sinkegal, Norman S, DPM  HYDROcodone-acetaminophen (NORCO/VICODIN) 5-325 MG tablet Take 1-2 tablets by mouth every 6 (six) hours as needed. 03/11/17   Horton, Mayer Maskerourtney F, MD  metroNIDAZOLE (FLAGYL) 500 MG tablet Take 1 tablet (500 mg total) by mouth 2 (two) times daily. 03/11/17   Horton, Mayer Maskerourtney F, MD    Family History No family history on file.  Social  History Social History   Tobacco Use  . Smoking status: Current Every Day Smoker  . Smokeless tobacco: Never Used  Substance Use Topics  . Alcohol use: Yes    Comment: occ  . Drug use: No     Allergies   Patient has no known allergies.   Review of Systems Review of Systems  Constitutional: Positive for activity change.  Respiratory: Negative for shortness of breath.   Cardiovascular: Negative for chest pain.  Neurological: Positive for light-headedness. Negative for headaches.     Physical Exam Updated Vital Signs BP (!) 145/94   Pulse 68   Temp 97.8 F (36.6 C) (Oral)   Resp 18   Ht 5\' 11"  (1.803 m)   Wt 108.9 kg   SpO2 100%   BMI 33.47 kg/m   Physical Exam Vitals signs and nursing note reviewed.  Constitutional:      Appearance: He is well-developed.  HENT:     Head: Atraumatic.  Neck:     Musculoskeletal: Neck supple.  Cardiovascular:     Rate and Rhythm: Normal rate.  Pulmonary:     Effort: Pulmonary effort is normal.  Skin:    General: Skin is warm.  Neurological:     Mental Status: He is alert and oriented to person, place, and time.      ED Treatments / Results  Labs (  all labs ordered are listed, but only abnormal results are displayed) Labs Reviewed - No data to display  EKG None  Radiology No results found.  Procedures Procedures (including critical care time)  Medications Ordered in ED Medications - No data to display   Initial Impression / Assessment and Plan / ED Course  I have reviewed the triage vital signs and the nursing notes.  Pertinent labs & imaging results that were available during my care of the patient were reviewed by me and considered in my medical decision making (see chart for details).     58 year old male comes in with chief complaint of chest pain.  He does not have any significant medical problems.  It seems like he has had some isolated elevated blood pressure readings over the past few days.  He has  been using a hand-held blood pressure device.  We will give him instructions on how to measure blood pressure and have advised him to log his blood pressure read.  Patient's blood pressure in the ER have been ranged between 1 33-1 45 for systolic and 91-101 for diastolic.  It is possible that he is having early hypertension, I have already advised him to see if he can tweak his lifestyle.   Unfortunately patient has insurance but does not have PCP.  I have given him outpatient follow-up information as well.  Final Clinical Impressions(s) / ED Diagnoses   Final diagnoses:  Hypertension, unspecified type    ED Discharge Orders    None       Derwood KaplanNanavati, Airanna Partin, MD 11/08/18 [phone]

## 2018-11-08 NOTE — ED Triage Notes (Signed)
Pt c/o elevated blood pressure at home with mild headache and feeling like he is going to pass out,

## 2018-11-08 NOTE — ED Notes (Signed)
Pt reports that he was seen at [facility] a few days ago for the same and was advised to monitor his bp,

## 2019-12-05 ENCOUNTER — Ambulatory Visit: Payer: Self-pay | Attending: Internal Medicine

## 2019-12-05 DIAGNOSIS — Z23 Encounter for immunization: Secondary | ICD-10-CM | POA: Insufficient documentation

## 2019-12-05 NOTE — Progress Notes (Signed)
   Covid-19 Vaccination Clinic  Name:  Randy Alexander    MRN: [id number] DOB: Feb 10, 1961  12/05/2019  [name] was observed post Covid-19 immunization for 15 minutes without incidence. He was provided with Vaccine Information Sheet and instruction to access the V-Safe system.   [name] was instructed to call 911 with any severe reactions post vaccine: Marland Kitchen Difficulty breathing  . Swelling of your face and throat  . A fast heartbeat  . A bad rash all over your body  . Dizziness and weakness    Immunizations Administered    Name Date Dose VIS Date Route   Pfizer COVID-19 Vaccine 12/05/2019  4:09 PM 0.3 mL 09/16/2019 Intramuscular   Manufacturer: [employer], [employer]   Lot: [id number]   NDC: [address]-0

## 2019-12-28 ENCOUNTER — Ambulatory Visit: Payer: Self-pay | Attending: Internal Medicine

## 2019-12-28 DIAGNOSIS — Z23 Encounter for immunization: Secondary | ICD-10-CM

## 2019-12-28 NOTE — Progress Notes (Signed)
   Covid-19 Vaccination Clinic  Name:  Randy Alexander    MRN: [id number] DOB: [birth year]  12/28/2019  [name] was observed post Covid-19 immunization for 15 minutes without incident. He was provided with Vaccine Information Sheet and instruction to access the V-Safe system.   [name] was instructed to call 911 with any severe reactions post vaccine: Marland Kitchen Difficulty breathing  . Swelling of face and throat  . A fast heartbeat  . A bad rash all over body  . Dizziness and weakness   Immunizations Administered    Name Date Dose VIS Date Route   Pfizer COVID-19 Vaccine 12/28/2019  3:28 PM 0.3 mL 09/16/2019 Intramuscular   Manufacturer: [employer], [employer]   Lot: [id number]   NDC: [address]-4

## 2022-02-10 ENCOUNTER — Other Ambulatory Visit: Payer: Self-pay | Admitting: Internal Medicine

## 2022-02-10 ENCOUNTER — Ambulatory Visit
Admission: RE | Admit: 2022-02-10 | Discharge: 2022-02-10 | Disposition: A | Discharge status: Home / Self Care | Payer: Managed Care, Other (non HMO) | Source: Ambulatory Visit | Attending: Internal Medicine | Admitting: Internal Medicine

## 2022-02-10 DIAGNOSIS — S99921A Unspecified injury of right foot, initial encounter: Secondary | ICD-10-CM

## 2023-01-19 IMAGING — DX DG TOE GREAT 2+V*R*
3 series · 3 of 3 positions shown · non-contrast
Comparison: No comparison studies available.

CLINICAL DATA: Patient stepped in a hole. Persistent great toe
pain.

EXAM:
RIGHT GREAT TOE

[dg toe great right (1 of 3)]
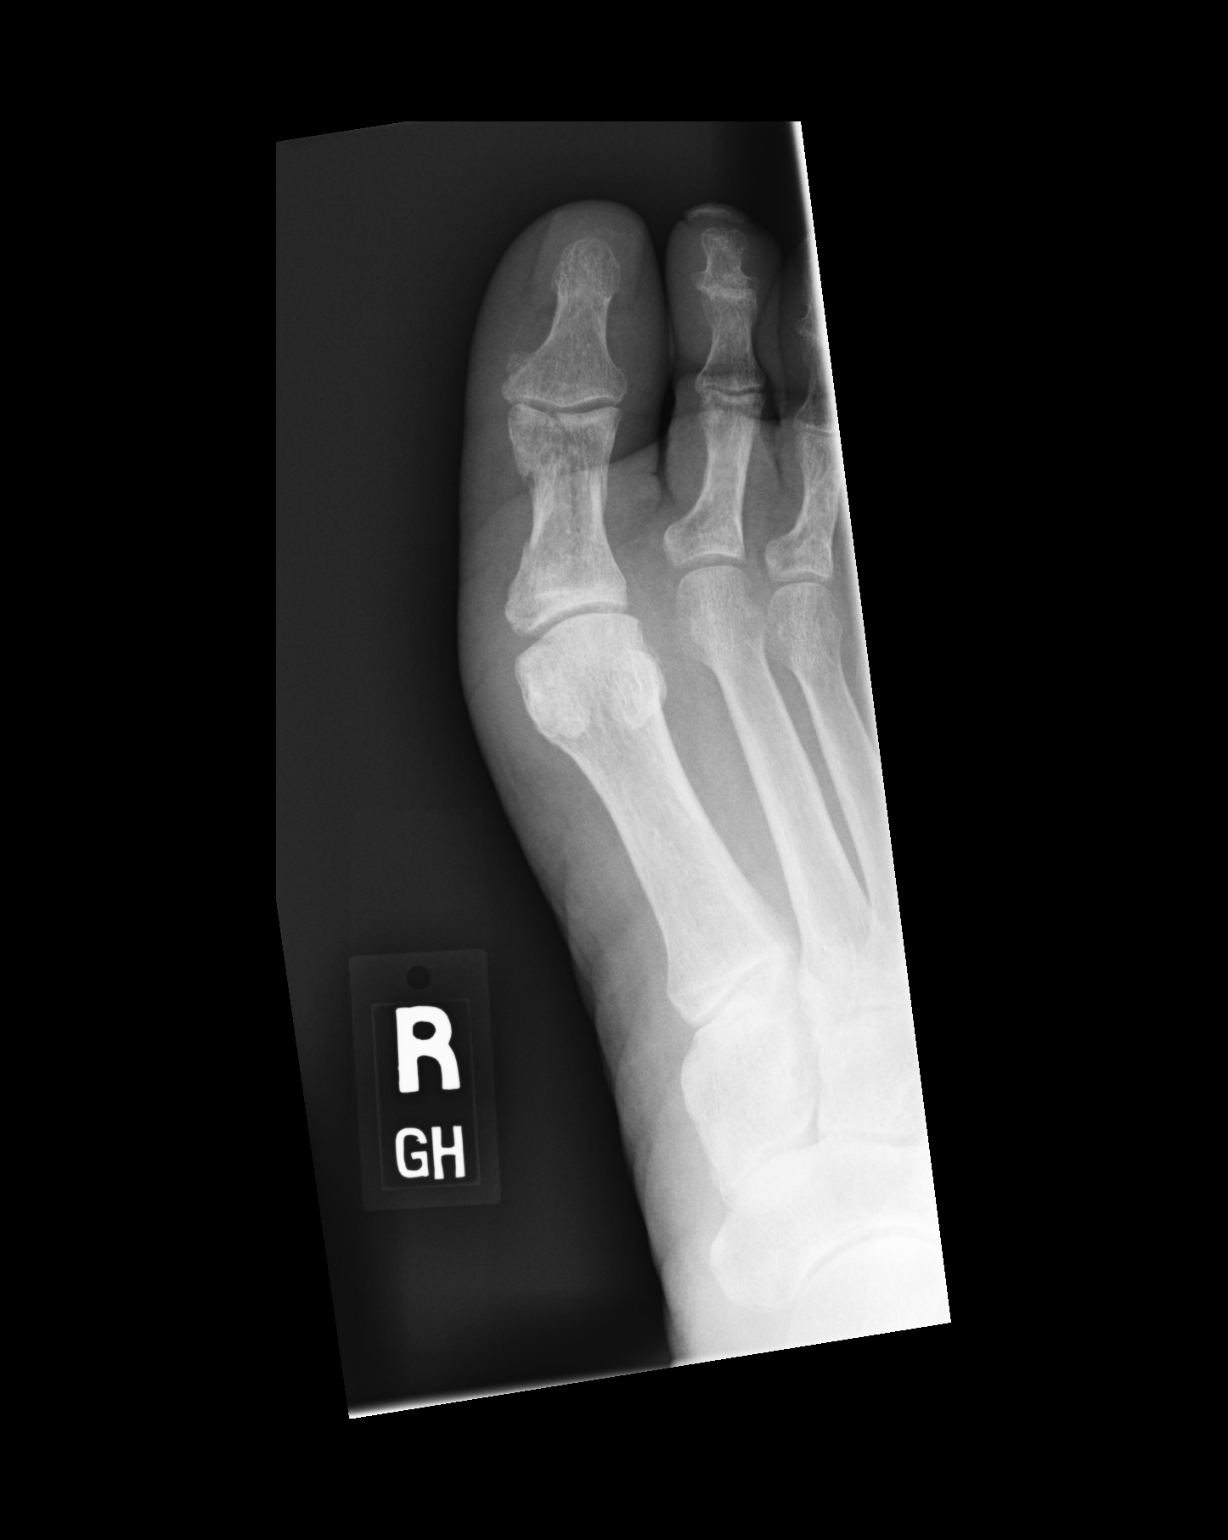

[dg toe great right (2 of 3)]
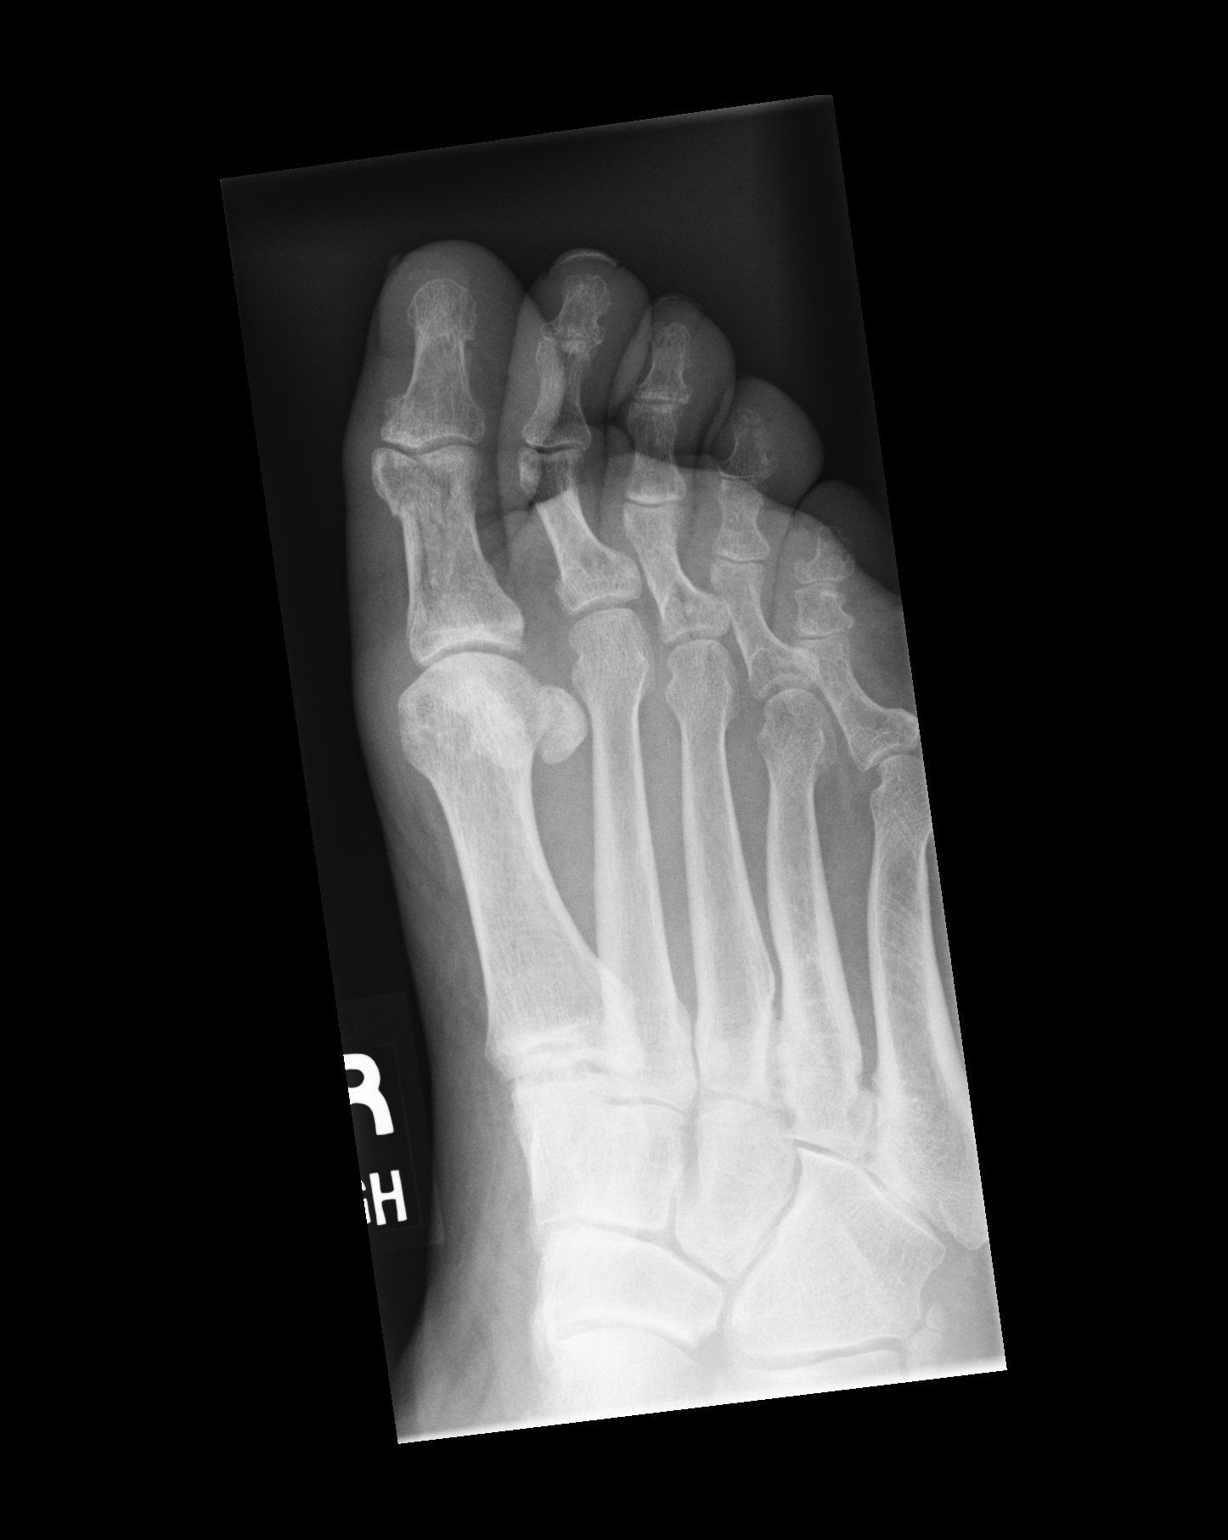

[dg toe great right (3 of 3)]
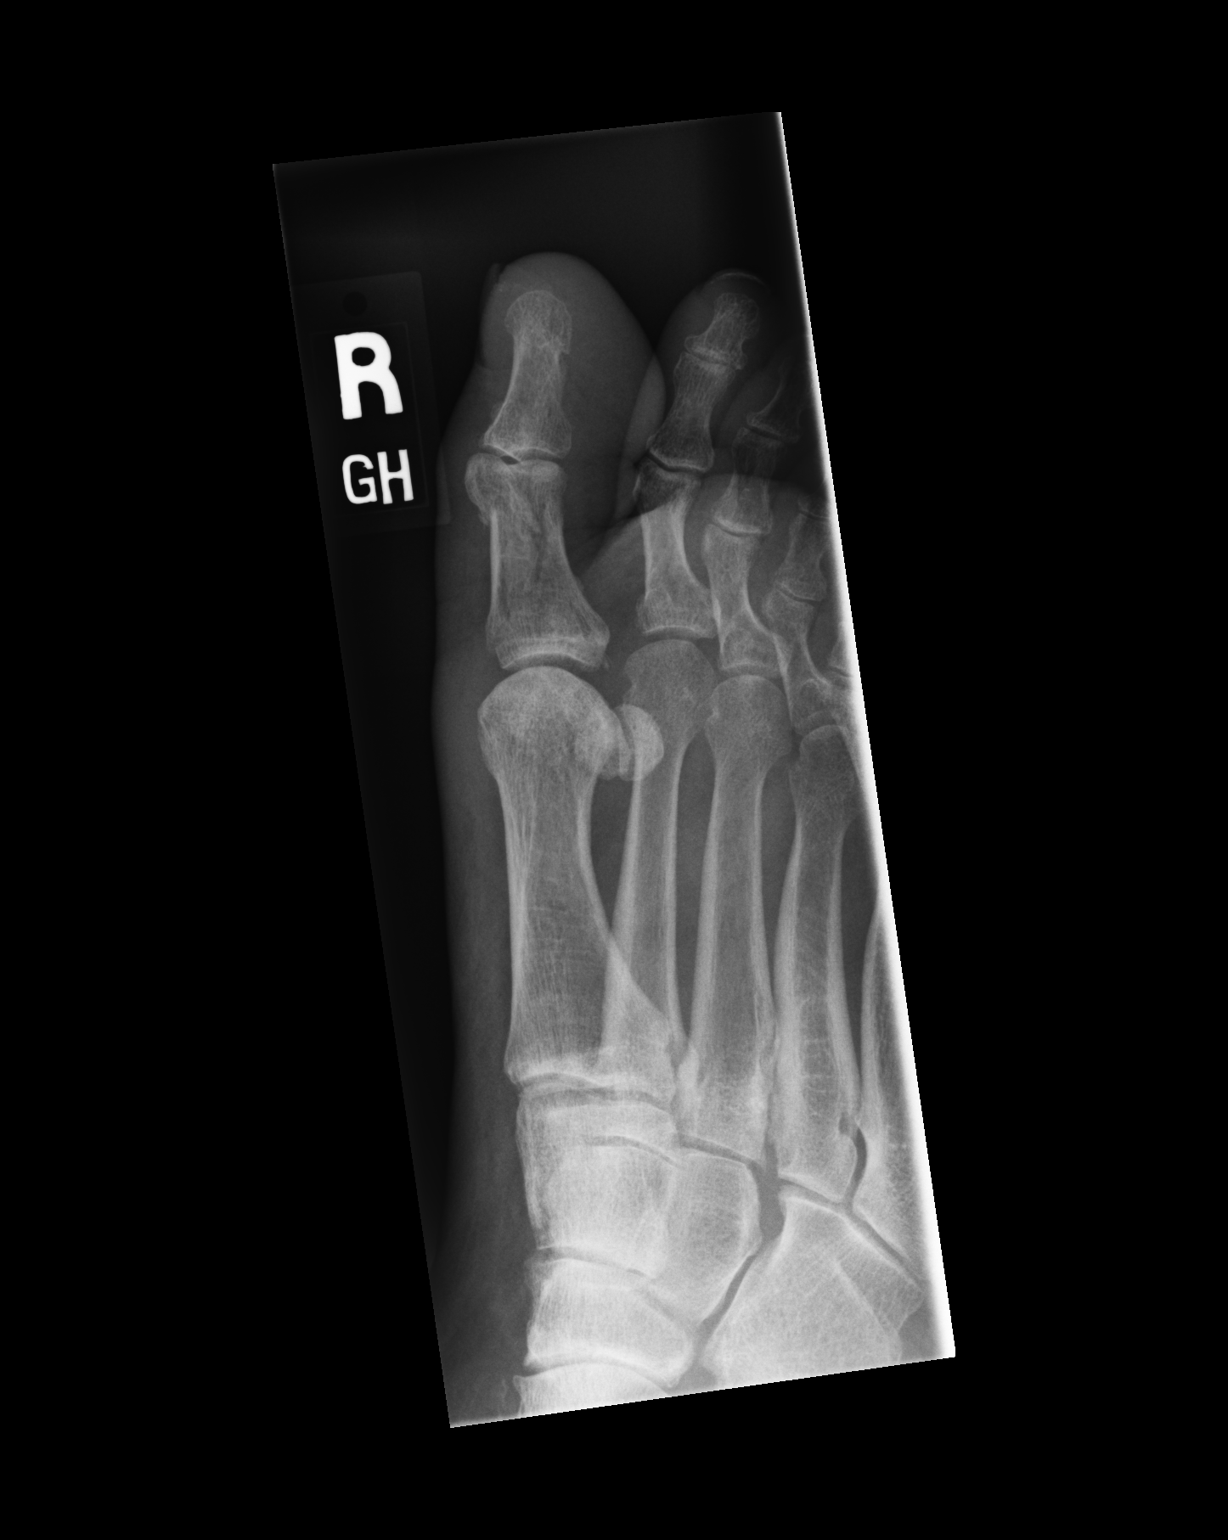

[3 of 3 positions shown; findings below may reference images not displayed]

FINDINGS: Comminuted fracture of the great toe proximal phalanx identified
with fracture extension into the distal articular surface at the IP
joint. Mild degenerative changes are seen in the MTP joint of the
great toe.
IMPRESSION: Comminuted fracture of the great toe proximal phalanx with extension
into the articular surface of the IP joint.
